# Patient Record
Sex: Male | Born: 2000 | Race: Black or African American | Hispanic: No | Marital: Single | Smoking: Current some day smoker
Health system: Southern US, Community
[De-identification: ages and names within clinical notes are randomized; demographics above are authoritative.]

## PROBLEM LIST (undated history)

## (undated) DIAGNOSIS — I10 Essential (primary) hypertension: Secondary | ICD-10-CM

---
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
## 2020-07-28 DIAGNOSIS — Z20822 Contact with and (suspected) exposure to covid-19: Secondary | ICD-10-CM | POA: Diagnosis not present

## 2021-08-21 ENCOUNTER — Encounter: Payer: Self-pay | Admitting: Emergency Medicine

## 2021-08-21 ENCOUNTER — Other Ambulatory Visit: Payer: Self-pay

## 2021-08-21 ENCOUNTER — Emergency Department
Admission: EM | Admit: 2021-08-21 | Discharge: 2021-08-21 | Disposition: A | Discharge status: Home / Self Care | Payer: Medicaid Other | Attending: Emergency Medicine | Admitting: Emergency Medicine

## 2021-08-21 DIAGNOSIS — B353 Tinea pedis: Secondary | ICD-10-CM | POA: Diagnosis not present

## 2021-08-21 DIAGNOSIS — L723 Sebaceous cyst: Secondary | ICD-10-CM | POA: Insufficient documentation

## 2021-08-21 DIAGNOSIS — I10 Essential (primary) hypertension: Secondary | ICD-10-CM | POA: Insufficient documentation

## 2021-08-21 MED ORDER — KETOCONAZOLE 2 % EX CREA: 1.0000 "application " | TOPICAL_CREAM | Freq: Every day | CUTANEOUS | 0 refills | Status: AC

## 2021-08-21 NOTE — ED Provider Notes (Signed)
[facility] Provider Note    Event Date/Time   First MD Initiated Contact with Patient 08/21/21 0057     (approximate)   History   Foot Pain   HPI  Tristan Sutton is a 21 y.o. male with hypertension off medications for over a year who presents to the emergency department with his mother for multiple complaints.  Patient has had a "knot" to the left posterior neck that has been present for years.  His mother is asking that we evaluate this area.  It is nontender to palpation without redness, warmth.  No drainage.  No injury.  No midline spinal tenderness.  Patient also has had discoloration to his left foot.  States he has had what he thought was dry skin to both feet for weeks.  He has used over-the-counter Lamisil once.  States he started using peroxide and then noticed discoloration to the skin to the top of the foot.  He denies any injury, pain.  States at times his feet will itch.  He is able to ambulate.  No fever.  No numbness, tingling or weakness.  Also noted to be hypertensive here.  States he has been off medications for over a year and does not have a primary care doctor.  He states he thinks his blood pressure is elevated because he is "mad that he is here".  It seems like his mother encouraged him to come to the emergency department.  He denies headache, vision changes, chest pain, shortness of breath, numbness, tingling, weakness.   History provided by patient and mother.    Past Medical History:  Diagnosis Date   Hypertension     No past surgical history on file.  MEDICATIONS:  Prior to Admission medications   Medication Sig Start Date End Date Taking? Authorizing Provider  acetaminophen-codeine (TYLENOL #3) 300-30 MG tablet Take 1 tablet by mouth every 8 (eight) hours as needed for moderate pain. 06/22/17   Menshew, Dannielle Karvonen, PA-C    Physical Exam   Triage Vital Signs: ED Triage Vitals  Enc Vitals Group     BP 08/21/21 0034  (!) 160/108     Pulse Rate 08/21/21 0034 90     Resp 08/21/21 0034 18     Temp 08/21/21 0034 98.6 F (37 C)     Temp Source 08/21/21 0034 Oral     SpO2 08/21/21 0034 100 %     Weight --      Height --      Head Circumference --      Peak Flow --      Pain Score 08/21/21 0032 0     Pain Loc --      Pain Edu? --      Excl. in [place]? --     Most recent vital signs: Vitals:   08/21/21 0034 08/21/21 0114  BP: (!) 160/108 (!) 155/97  Pulse: 90 86  Resp: 18 18  Temp: 98.6 F (37 C)   SpO2: 100% 99%    CONSTITUTIONAL: Alert and oriented and responds appropriately to questions. Well-appearing; well-nourished HEAD: Normocephalic, atraumatic EYES: Conjunctivae clear, pupils appear equal, sclera nonicteric ENT: normal nose; moist mucous membranes NECK: Supple, normal ROM, no midline spinal tenderness or step-off or deformity.  He has an approximately 3 x 4 cm raised area to the left posterior neck that seems consistent with an epidermoid cyst, less likely lipoma without redness, warmth, fluctuance, induration, drainage or tenderness.  Area is firm.  No cervical lymphadenopathy. CARD: RRR; S1 and S2 appreciated; no murmurs, no clicks, no rubs, no gallops RESP: Normal chest excursion without splinting or tachypnea; breath sounds clear and equal bilaterally; no wheezes, no rhonchi, no rales, no hypoxia or respiratory distress, speaking full sentences ABD/GI: Normal bowel sounds; non-distended; soft, non-tender, no rebound, no guarding, no peritoneal signs BACK: The back appears normal EXT: Normal ROM in all joints; no deformity noted, no edema; no cyanosis, 2+ DP pulses bilaterally, no calf tenderness or calf swelling, patient does have bilateral onychomycotic toenails SKIN: Normal color for age and race; warm; patient has dry appearing skin to the bottom of his feet without redness or warmth.  He has some slight discoloration to the top of the left foot but no open wounds or drainage.  It is not  gangrenous, cyanotic. NEURO: Moves all extremities equally, normal speech, no facial asymmetry, normal gait PSYCH: The patient's mood and manner are appropriate.   ED Results / Procedures / Treatments   LABS: (all labs ordered are listed, but only abnormal results are displayed) Labs Reviewed - No data to display   EKG:   RADIOLOGY: My personal review and interpretation of imaging:    I have personally reviewed all radiology reports.   No results found.   PROCEDURES:  Critical Care performed: No      Procedures    IMPRESSION / MDM / ASSESSMENT AND PLAN / ED COURSE  I reviewed the triage vital signs and the nursing notes.    Patient here with multiple chronic complaints.    DIFFERENTIAL DIAGNOSIS (includes but not limited to):   Suspect epidermoid cyst as the lesion on the posterior neck.  Less likely lipoma.  No sign of superimposed infection.  No bony tenderness or bony abnormality.  Suspect tinea pedis causing his foot itching, "dry skin".  No sign of cellulitis, abscess, necrosis, gangrene, arterial obstruction, DVT, compartment syndrome, peripheral edema.  Patient also here with asymptomatic hypertension.  Doubt ACS, intracranial hemorrhage, CVA, endorgan damage, hypertensive urgency/emergency.   Patient's presentation is most consistent with exacerbation of chronic illness.   PLAN: Patient here with multiple complaints that seem chronic in nature.  It seems his mother's biggest concern was getting him outpatient follow-up.  He is hypertensive here but this has improved on recheck.  Have recommended that he follow-up with a primary care doctor to determine if he should be put back on blood pressure medication.  He is not having any symptoms today and I do not feel needs further emergent work-up for this.  He also has what appears to be a noninfected epidermoid cyst that has been present for over a year to the posterior left neck.  This does not need emergent  drainage.  Will refer to dermatology to discuss if he wants this area excised.  It is not causing him any pain and he has full range of motion in the neck.  I do not think this is a lipoma.  No cervical lymphadenopathy.   Patient also complaining of discoloration, itching and dry skin to his feet.  No sign of gangrene, cyanosis.  No sign of DVT, arterial obstruction, compartment syndrome, cellulitis today.  Suspect tinea pedis.  We will put him on ketoconazole.  He does have bilateral onychomycosis as well.  Discussed with him that he can follow-up with a primary care doctor and/or dermatology for this.   Given outpatient PCP and dermatology follow-up information.  MEDICATIONS GIVEN IN ED: Medications - No data to  display   ED COURSE:  At this time, I do not feel there is any life-threatening condition present. I reviewed all nursing notes, vitals, pertinent previous records.  All lab and urine results, EKGs, imaging ordered have been independently reviewed and interpreted by myself.  I reviewed all available radiology reports from any imaging ordered this visit.  Based on my assessment, I feel the patient is safe to be discharged home without further emergent workup and can continue workup as an outpatient as needed. Discussed all findings, treatment plan as well as usual and customary return precautions with patient and mother.  They verbalize understanding and are comfortable with this plan.  Outpatient follow-up has been provided as needed.  All questions have been answered.    CONSULTS: No emergent consult or admission needed at this time for patient's chronic medical conditions.   OUTSIDE RECORDS REVIEWED: Reviewed patient's birth note on December 09, 2000.       FINAL CLINICAL IMPRESSION(S) / ED DIAGNOSES   Final diagnoses:  Tinea pedis of both feet  Hypertension, unspecified type  Sebaceous cyst     Rx / DC Orders   ED Discharge Orders          Ordered    ketoconazole (NIZORAL)  2 % cream  Daily        08/21/21 0111             Note:  This document was prepared using Dragon voice recognition software and may include unintentional dictation errors.   Berdene Askari, Delice Bison, DO 08/21/21 [phone]

## 2021-08-21 NOTE — ED Notes (Signed)
Pt presents with top of left foot discolored, darker than skin stone. Pt denies  pain or numbness in foot.

## 2021-08-21 NOTE — ED Notes (Signed)
Pt also presents with nodule on back of neck that started 2-3 years ago and has steadily increased size during this time. Denies  pain in area.Marland Kitchen

## 2021-08-21 NOTE — ED Triage Notes (Addendum)
Pt reports having dry skin on left foot to which he applied peroxide.  Now concerned that since he put the peroxide on it the skin looks dark.  Mom reports pt has had continuing problems with odor to his feet and his toe nails are thick.  Mom also wants to have a place on the back of the pt's neck looked at.  Pt is supposed to be on BP meds but states he stopped because his BP was better.

## 2021-08-21 NOTE — Discharge Instructions (Signed)
Steps to find a Primary Care Provider (PCP):  Call [phone] or [phone] to access "[place] Find a Doctor Service."  2.  You may also go on the [place] website at [email or web address]  

## 2021-08-22 ENCOUNTER — Telehealth: Payer: Self-pay

## 2021-08-22 NOTE — Telephone Encounter (Signed)
Transition Care Management Unsuccessful Follow-up Telephone Call  Date of discharge and from where:  08/21/2021 from [facility]  Attempts:  1st Attempt  Reason for unsuccessful TCM follow-up call:  Unable to leave message    

## 2021-08-23 NOTE — Telephone Encounter (Signed)
Transition Care Management Unsuccessful Follow-up Telephone Call  Date of discharge and from where:  08/21/2021 from [facility]  Attempts:  2nd Attempt  Reason for unsuccessful TCM follow-up call:  Unable to leave message    

## 2021-08-24 NOTE — Telephone Encounter (Signed)
Transition Care Management Unsuccessful Follow-up Telephone Call  Date of discharge and from where:  08/21/2021 from [facility]  Attempts:  3rd Attempt  Reason for unsuccessful TCM follow-up call:  Unable to reach patient

## 2022-03-25 ENCOUNTER — Encounter (HOSPITAL_COMMUNITY): Payer: Self-pay

## 2022-03-25 ENCOUNTER — Emergency Department (HOSPITAL_COMMUNITY)
Admission: EM | Admit: 2022-03-25 | Discharge: 2022-03-25 | Disposition: A | Discharge status: Home / Self Care | Payer: Medicaid Other | Attending: Emergency Medicine | Admitting: Emergency Medicine

## 2022-03-25 ENCOUNTER — Other Ambulatory Visit: Payer: Self-pay

## 2022-03-25 DIAGNOSIS — R112 Nausea with vomiting, unspecified: Secondary | ICD-10-CM | POA: Insufficient documentation

## 2022-03-25 DIAGNOSIS — R519 Headache, unspecified: Secondary | ICD-10-CM | POA: Diagnosis not present

## 2022-03-25 DIAGNOSIS — I10 Essential (primary) hypertension: Secondary | ICD-10-CM | POA: Diagnosis not present

## 2022-03-25 DIAGNOSIS — R739 Hyperglycemia, unspecified: Secondary | ICD-10-CM | POA: Insufficient documentation

## 2022-03-25 DIAGNOSIS — E1165 Type 2 diabetes mellitus with hyperglycemia: Secondary | ICD-10-CM | POA: Diagnosis not present

## 2022-03-25 LAB — CBC
HCT: 45.5 % (ref 39.0–52.0)
Hemoglobin: 15.7 g/dL (ref 13.0–17.0)
MCH: 29.3 pg (ref 26.0–34.0)
MCHC: 34.5 g/dL (ref 30.0–36.0)
MCV: 85 fL (ref 80.0–100.0)
Platelets: 388 10*3/uL (ref 150–400)
RBC: 5.35 MIL/uL (ref 4.22–5.81)
RDW: 11.9 % (ref 11.5–15.5)
WBC: 8.8 10*3/uL (ref 4.0–10.5)
nRBC: 0 % (ref 0.0–0.2)

## 2022-03-25 LAB — COMPREHENSIVE METABOLIC PANEL
ALT: 10 U/L (ref 0–44)
AST: 14 U/L — ABNORMAL LOW (ref 15–41)
Albumin: 4 g/dL (ref 3.5–5.0)
Alkaline Phosphatase: 69 U/L (ref 38–126)
Anion gap: 12 (ref 5–15)
BUN: 6 mg/dL (ref 6–20)
CO2: 23 mmol/L (ref 22–32)
Calcium: 9.1 mg/dL (ref 8.9–10.3)
Chloride: 98 mmol/L (ref 98–111)
Creatinine, Ser: 0.72 mg/dL (ref 0.61–1.24)
GFR, Estimated: >60 mL/min (ref >60)
Glucose, Bld: 321 mg/dL — ABNORMAL HIGH (ref 70–99)
Potassium: 4 mmol/L (ref 3.5–5.1)
Sodium: 133 mmol/L — ABNORMAL LOW (ref 135–145)
Total Bilirubin: 0.8 mg/dL (ref 0.3–1.2)
Total Protein: 7 g/dL (ref 6.5–8.1)

## 2022-03-25 LAB — URINALYSIS, ROUTINE W REFLEX MICROSCOPIC
Bilirubin Urine: NEGATIVE
Glucose, UA: >=500 mg/dL — AB
Hgb urine dipstick: NEGATIVE
Ketones, ur: 80 mg/dL — AB
Leukocytes,Ua: NEGATIVE
Nitrite: NEGATIVE
Protein, ur: 30 mg/dL — AB
Specific Gravity, Urine: 1.039 — ABNORMAL HIGH (ref 1.005–1.030)
pH: 6 (ref 5.0–8.0)

## 2022-03-25 LAB — LIPASE, BLOOD: Lipase: 29 U/L (ref 11–51)

## 2022-03-25 MED ORDER — KETOROLAC TROMETHAMINE 30 MG/ML IJ SOLN
30.0000 mg | Freq: Once | INTRAMUSCULAR | Status: AC
  Administered 2022-03-25: 30 mg via INTRAMUSCULAR
  Filled 2022-03-25: qty 1

## 2022-03-25 MED ORDER — METFORMIN HCL 500 MG PO TABS: 500.0000 mg | ORAL_TABLET | Freq: Two times a day (BID) | ORAL | 0 refills | Status: AC

## 2022-03-25 MED ORDER — ONDANSETRON 4 MG PO TBDP: 4.0000 mg | ORAL_TABLET | Freq: Three times a day (TID) | ORAL | 0 refills | Status: AC | PRN

## 2022-03-25 MED ORDER — ONDANSETRON 4 MG PO TBDP
4.0000 mg | ORAL_TABLET | Freq: Once | ORAL | Status: AC
  Administered 2022-03-25: 4 mg via ORAL
  Filled 2022-03-25: qty 1

## 2022-03-25 NOTE — ED Provider Notes (Signed)
Emergency Department Provider Note   I have reviewed the triage vital signs and the nursing notes.   HISTORY  Chief Complaint Emesis and Headache   HPI Tristan Sutton is a 22 y.o. male past history of hypertension presents to the emergency department with vomiting and headache.  Patient states initially headache began yesterday.  It was gradual in onset but he was able to get to sleep.  He took some ibuprofen and this morning woke up with continued headache although slightly improved but had then developed nausea and 2 episodes of vomiting.  No chest pain, shortness of breath, abdominal pain.  No fevers.  No upper respiratory infection symptoms. Nausea has improved but HA remains, although improved overall from yesterday.    Past Medical History:  Diagnosis Date   Hypertension     Review of Systems  Constitutional: No fever/chills Cardiovascular: Denies chest pain. Respiratory: Denies shortness of breath. Gastrointestinal: No abdominal pain. Positive nausea and vomiting.  No diarrhea.  No constipation. Genitourinary: Negative for dysuria. Musculoskeletal: Negative for back pain. Skin: Negative for rash. Neurological: Negative for focal weakness or numbness. Positive HA.   ____________________________________________   PHYSICAL EXAM:  VITAL SIGNS: ED Triage Vitals  Enc Vitals Group     BP 03/25/22 1326 (!) 163/79     Pulse Rate 03/25/22 1326 99     Resp 03/25/22 1326 16     Temp 03/25/22 1326 98.5 F (36.9 C)     Temp src --      SpO2 03/25/22 1326 98 %     Weight 03/25/22 1336 240 lb 1.3 oz (108.9 kg)     Height 03/25/22 1336 6\' 3"  (1.905 m)   Constitutional: Alert and oriented. Well appearing and in no acute distress. Eyes: Conjunctivae are normal. PERRL. EOMI. Head: Atraumatic. Nose: No congestion/rhinnorhea. Mouth/Throat: Mucous membranes are moist.  Neck: No stridor.  Cardiovascular: Normal rate, regular rhythm. Good peripheral circulation. Grossly  normal heart sounds.   Respiratory: Normal respiratory effort.  No retractions. Lungs CTAB. Gastrointestinal: Soft and nontender. No distention.  Musculoskeletal: No gross deformities of extremities. Neurologic:  Normal speech and language.  Skin:  Skin is warm, dry and intact. No rash noted.  ____________________________________________   LABS (all labs ordered are listed, but only abnormal results are displayed)  Labs Reviewed  COMPREHENSIVE METABOLIC PANEL - Abnormal; Notable for the following components:      Result Value   Sodium 133 (*)    Glucose, Bld 321 (*)    AST 14 (*)    All other components within normal limits  URINALYSIS, ROUTINE W REFLEX MICROSCOPIC - Abnormal; Notable for the following components:   Specific Gravity, Urine 1.039 (*)    Glucose, UA >=500 (*)    Ketones, ur 80 (*)    Protein, ur 30 (*)    Bacteria, UA RARE (*)    All other components within normal limits  LIPASE, BLOOD  CBC   ____________________________________________   PROCEDURES  Procedure(s) performed:   Procedures  None  ____________________________________________   INITIAL IMPRESSION / ASSESSMENT AND PLAN / ED COURSE  Pertinent labs & imaging results that were available during my care of the patient were reviewed by me and considered in my medical decision making (see chart for details).   This patient is Presenting for Evaluation of HA, which does require a range of treatment options, and is a complaint that involves a high risk of morbidity and mortality.  The Differential Diagnoses includes but is  not exclusive to subarachnoid hemorrhage, meningitis, encephalitis, previous head trauma, cavernous venous thrombosis, muscle tension headache, glaucoma, temporal arteritis, migraine or migraine equivalent, etc.  Clinical Laboratory Tests Ordered, included patient with hyperglycemia to 321.  This is not a fasting level.  No evidence of acidosis or anion gap on labs.  Does have  glucosuria and ketones on UA.  Lipase and LFTs are normal.  No leukocytosis or severe anemia.  Radiologic Tests: Considered neuroimaging but no acute onset maximal intensity headache to strongly suspect subarachnoid hemorrhage.  Neuroexam is unremarkable.  Symptoms improving from yesterday after ibuprofen at home.   Medical Decision Making: Summary:  Patient presents emergency department with headache along with nausea vomiting this morning.  Overall he is neuro intact.  Labs are reassuring.  He does have hyperglycemia which I discussed.  No evidence of DKA.  Reevaluation with update and discussion with patient. He is feeling improved. Plan for discharge.    Patient's presentation is most consistent with acute illness / injury with system symptoms.   Disposition: discharge  ____________________________________________  FINAL CLINICAL IMPRESSION(S) / ED DIAGNOSES  Final diagnoses:  Bad headache  Nausea and vomiting, unspecified vomiting type  Hyperglycemia     NEW OUTPATIENT MEDICATIONS STARTED DURING THIS VISIT:  Discharge Medication List as of 03/25/2022  5:48 PM     START taking these medications   Details  metFORMIN (GLUCOPHAGE) 500 MG tablet Take 1 tablet (500 mg total) by mouth 2 (two) times daily with a meal., Starting Sat 03/25/2022, Until Mon 04/24/2022, Print    ondansetron (ZOFRAN-ODT) 4 MG disintegrating tablet Take 1 tablet (4 mg total) by mouth every 8 (eight) hours as needed for nausea or vomiting., Starting Sat 03/25/2022, Print        Note:  This document was prepared using Dragon voice recognition software and may include unintentional dictation errors.  Nanda Quinton, MD, [facility] Emergency Medicine    Raeden Schippers, Wonda [facility], MD 03/26/22 2322

## 2022-03-25 NOTE — ED Triage Notes (Signed)
Pt c/o HA and vomiting started yesterday. Pt denies any other sx. Pt states vomited two times.

## 2022-03-25 NOTE — Discharge Instructions (Signed)
You were seen in the emergency department today with headache and vomiting.  Your workup here shows elevated blood sugar levels.  I am unsure if these are causing your symptoms but we should treat this as if you may to be developing diabetes at this point.  I have attached some information on high blood sugar and will have you limit sugar and other carbohydrate in your diet.  You do need to establish care with a primary care physician as soon as possible.  They will be able to continue this workup and manage her blood sugars [place]-term.  This can lead to significant medical problems in the future if you do not deal with that early.

## 2022-03-25 NOTE — ED Provider Triage Note (Signed)
Emergency Medicine Provider Triage Evaluation Note  Tristan Sutton , a 22 y.o. male  was evaluated in triage.  Pt complains of headache for the past 2 to 3 days.  Patient reports that he had some nausea and vomiting this morning but denies any abdominal pain.  No photophobia or blurry vision.  No recent cough or cold symptoms.  No fevers.  No neck stiffness..  Review of Systems  Positive:  Negative:   Physical Exam  BP (!) 163/79 (BP Location: Right Arm)   Pulse 99   Temp 98.5 F (36.9 C)   Resp 16   Ht 6\' 3"  (1.905 m)   Wt 108.9 kg   SpO2 98%   BMI 30.01 kg/m  Gen:   Awake, no distress   Resp:  Normal effort  MSK:   Moves extremities without difficulty  Other:  Full range of motion of the neck.  Cranial nerves II through XII grossly intact.  Moving all upper and lower extremities.  Medical Decision Making  Medically screening exam initiated at 2:00 PM.  Appropriate orders placed.  Tristan Sutton was informed that the remainder of the evaluation will be completed by another provider, this initial triage assessment does not replace that evaluation, and the importance of remaining in the ED until their evaluation is complete.  Basic labs ordered.  Unable to order any flu or COVID due to shortages.   Sherrell Puller, [place] 03/25/22 [phone]

## 2022-03-25 NOTE — ED Notes (Signed)
Patient tolerated oral intake well

## 2022-03-28 ENCOUNTER — Telehealth: Payer: Self-pay | Admitting: Licensed Clinical Social Worker

## 2022-03-28 NOTE — Patient Outreach (Signed)
Transition Care Management Unsuccessful Follow-up Telephone Call  Date of discharge and from where:  03/25/22 from [facility]  Attempts:  1st Attempt  Reason for unsuccessful TCM follow-up call:  Unable to leave message

## 2023-12-17 DIAGNOSIS — Z202 Contact with and (suspected) exposure to infections with a predominantly sexual mode of transmission: Secondary | ICD-10-CM | POA: Diagnosis not present
